# Patient Record
Sex: Female | Born: 1990 | Race: White | Hispanic: No | Marital: Single | Smoking: Never smoker
Health system: Southern US, Community
[De-identification: ages and names within clinical notes are randomized; demographics above are authoritative.]

---

## 2010-08-29 ENCOUNTER — Other Ambulatory Visit (HOSPITAL_COMMUNITY)
Admission: RE | Admit: 2010-08-29 | Discharge: 2010-08-29 | Disposition: A | Discharge status: Home / Self Care | Payer: BC Managed Care – PPO | Source: Ambulatory Visit | Attending: Family Medicine | Admitting: Family Medicine

## 2010-08-29 DIAGNOSIS — Z01419 Encounter for gynecological examination (general) (routine) without abnormal findings: Secondary | ICD-10-CM | POA: Insufficient documentation

## 2011-08-30 ENCOUNTER — Other Ambulatory Visit (HOSPITAL_COMMUNITY)
Admission: RE | Admit: 2011-08-30 | Discharge: 2011-08-30 | Disposition: A | Discharge status: Home / Self Care | Payer: BC Managed Care – PPO | Source: Ambulatory Visit | Attending: Family Medicine | Admitting: Family Medicine

## 2011-08-30 DIAGNOSIS — Z124 Encounter for screening for malignant neoplasm of cervix: Secondary | ICD-10-CM | POA: Insufficient documentation

## 2012-09-22 ENCOUNTER — Other Ambulatory Visit (HOSPITAL_COMMUNITY)
Admission: RE | Admit: 2012-09-22 | Discharge: 2012-09-22 | Disposition: A | Discharge status: Home / Self Care | Payer: BC Managed Care – PPO | Source: Ambulatory Visit | Attending: Family Medicine | Admitting: Family Medicine

## 2012-09-22 ENCOUNTER — Other Ambulatory Visit: Payer: Self-pay | Admitting: Physician Assistant

## 2012-09-22 DIAGNOSIS — Z124 Encounter for screening for malignant neoplasm of cervix: Secondary | ICD-10-CM | POA: Insufficient documentation

## 2013-10-09 ENCOUNTER — Other Ambulatory Visit: Payer: Self-pay | Admitting: Physician Assistant

## 2013-10-09 ENCOUNTER — Other Ambulatory Visit (HOSPITAL_COMMUNITY)
Admission: RE | Admit: 2013-10-09 | Discharge: 2013-10-09 | Disposition: A | Discharge status: Home / Self Care | Payer: BC Managed Care – PPO | Source: Ambulatory Visit | Attending: Family Medicine | Admitting: Family Medicine

## 2013-10-09 DIAGNOSIS — Z124 Encounter for screening for malignant neoplasm of cervix: Secondary | ICD-10-CM | POA: Insufficient documentation

## 2016-01-21 ENCOUNTER — Encounter (HOSPITAL_COMMUNITY): Payer: Self-pay

## 2016-01-21 ENCOUNTER — Emergency Department (HOSPITAL_COMMUNITY)
Admission: EM | Admit: 2016-01-21 | Discharge: 2016-01-21 | Disposition: A | Discharge status: Home / Self Care | Payer: BC Managed Care – PPO | Attending: Emergency Medicine | Admitting: Emergency Medicine

## 2016-01-21 ENCOUNTER — Emergency Department (HOSPITAL_COMMUNITY): Payer: BC Managed Care – PPO

## 2016-01-21 DIAGNOSIS — Y999 Unspecified external cause status: Secondary | ICD-10-CM | POA: Insufficient documentation

## 2016-01-21 DIAGNOSIS — Y939 Activity, unspecified: Secondary | ICD-10-CM | POA: Insufficient documentation

## 2016-01-21 DIAGNOSIS — M542 Cervicalgia: Secondary | ICD-10-CM | POA: Diagnosis not present

## 2016-01-21 DIAGNOSIS — Y92410 Unspecified street and highway as the place of occurrence of the external cause: Secondary | ICD-10-CM | POA: Diagnosis not present

## 2016-01-21 NOTE — ED Notes (Signed)
Per x-ray tech, pt received copy of x-ray. Pt still not in room.

## 2016-01-21 NOTE — ED Notes (Addendum)
Pt returned call - d/c instructions given and questions answered to satisfaction. States she ambulated to the lobby and her ride picked her up from the ED. Unable to re-assess pt's VS d/t left.
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
## 2016-01-21 NOTE — ED Notes (Signed)
Pt signed consent form from x-ray tech so may receive copy of her x-ray. Now pt not in room.

## 2016-01-21 NOTE — Discharge Instructions (Signed)
X-rays of your cervical spine were negative for any acute abnormalities.  Follow up with orthopedics in 3 days in your symptoms worsen or do not improve. Take OTC ibuprofen as needed for pain. Return to the emergency department if you experience worsening neck pain, headaches, visual changes, dizziness, nausea, vomiting, numbness or tingling, weakness or any other concerning symptoms.
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
## 2016-01-21 NOTE — ED Notes (Signed)
Left msg on pt's phone to return call so may discuss d/c paperwork. [phone]

## 2017-03-15 ENCOUNTER — Other Ambulatory Visit: Payer: Self-pay | Admitting: Physician Assistant

## 2017-03-15 ENCOUNTER — Ambulatory Visit
Admission: RE | Admit: 2017-03-15 | Discharge: 2017-03-15 | Disposition: A | Discharge status: Home / Self Care | Payer: BC Managed Care – PPO | Source: Ambulatory Visit | Attending: Physician Assistant | Admitting: Physician Assistant

## 2017-03-15 DIAGNOSIS — R7611 Nonspecific reaction to tuberculin skin test without active tuberculosis: Secondary | ICD-10-CM
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
## 2018-02-07 ENCOUNTER — Other Ambulatory Visit: Payer: Self-pay | Admitting: Physician Assistant

## 2018-02-07 ENCOUNTER — Other Ambulatory Visit (HOSPITAL_COMMUNITY)
Admission: RE | Admit: 2018-02-07 | Discharge: 2018-02-07 | Disposition: A | Discharge status: Home / Self Care | Payer: BC Managed Care – PPO | Source: Ambulatory Visit | Attending: Family Medicine | Admitting: Family Medicine

## 2018-02-07 DIAGNOSIS — Z124 Encounter for screening for malignant neoplasm of cervix: Secondary | ICD-10-CM | POA: Insufficient documentation

## 2018-02-11 LAB — CYTOLOGY - PAP: DIAGNOSIS: NEGATIVE
